# Patient Record
Sex: Female | Born: 1960 | Race: White | Hispanic: No | Marital: Married | Smoking: Never smoker
Health system: Southern US, Community
[De-identification: ages and names within clinical notes are randomized; demographics above are authoritative.]

---

## 2018-08-07 ENCOUNTER — Ambulatory Visit
Admission: EM | Admit: 2018-08-07 | Discharge: 2018-08-07 | Disposition: A | Discharge status: Home / Self Care | Payer: BLUE CROSS/BLUE SHIELD | Attending: Family Medicine | Admitting: Family Medicine

## 2018-08-07 ENCOUNTER — Other Ambulatory Visit: Payer: Self-pay

## 2018-08-07 ENCOUNTER — Ambulatory Visit (INDEPENDENT_AMBULATORY_CARE_PROVIDER_SITE_OTHER): Payer: BLUE CROSS/BLUE SHIELD

## 2018-08-07 ENCOUNTER — Encounter: Payer: Self-pay | Admitting: Emergency Medicine

## 2018-08-07 DIAGNOSIS — W2209XA Striking against other stationary object, initial encounter: Secondary | ICD-10-CM

## 2018-08-07 DIAGNOSIS — M79674 Pain in right toe(s): Secondary | ICD-10-CM

## 2018-08-07 DIAGNOSIS — S92501A Displaced unspecified fracture of right lesser toe(s), initial encounter for closed fracture: Secondary | ICD-10-CM | POA: Diagnosis not present

## 2018-08-07 NOTE — Discharge Instructions (Signed)
Rest.  Ice.  Naproxen as needed.  Take care  Dr. Adriana Simas

## 2018-08-07 NOTE — ED Provider Notes (Signed)
[facility]    CSN: [id number] Arrival date & time: 08/07/18  1040  History   Chief Complaint Chief Complaint  Patient presents with  . Toe Pain   HPI  58 year old female presents with right fifth toe pain.  Patient states that she injured her toe on Thursday after she accidentally hit it on the couch.  She states that she has had pain, bruising, and swelling.  Minimal pain currently.  Patient is concerned that she may have fractured her toe.  Patient is primarily concerned as she is an avid runner and wants to know when she will be able to return to her normal activities.  She has taken some naproxen with some improvement.  Exacerbated by activity.  No other associated symptoms.  No other complaints.  History reviewed and updated as below.  PMH: No significant PMH.  OB History   No obstetric history on file.    Home Medications    Prior to Admission medications   Not on File   Social History Social History   Tobacco Use  . Smoking status: Never Smoker  . Smokeless tobacco: Never Used  Substance Use Topics  . Alcohol use: Yes  . Drug use: Never   Allergies   Patient has no known allergies.  Review of Systems Review of Systems  Musculoskeletal:       Toe pain/injury.  Skin:       Bruising.   Physical Exam Triage Vital Signs ED Triage Vitals  Enc Vitals Group     BP 08/07/18 1051 (!) 155/95     Pulse Rate 08/07/18 1051 65     Resp 08/07/18 1051 18     Temp 08/07/18 1051 98.1 F (36.7 C)     Temp Source 08/07/18 1051 Oral     SpO2 08/07/18 1051 100 %     Weight 08/07/18 1052 105 lb (47.6 kg)     Height 08/07/18 1052 5\' 4"  (1.626 m)     Head Circumference --      Peak Flow --      Pain Score 08/07/18 1052 0     Pain Loc --      Pain Edu? --      Excl. in GC? --    Updated Vital Signs BP (!) 155/95 (BP Location: Right Arm)   Pulse 65   Temp 98.1 F (36.7 C) (Oral)   Resp 18   Ht 5\' 4"  (1.626 m)   Wt 47.6 kg   SpO2 100%   BMI 18.02  kg/m   Visual Acuity Right Eye Distance:   Left Eye Distance:   Bilateral Distance:    Right Eye Near:   Left Eye Near:    Bilateral Near:     Physical Exam Vitals signs and nursing note reviewed.  Constitutional:      General: She is not in acute distress.    Appearance: Normal appearance.  HENT:     Head: Normocephalic and atraumatic.  Eyes:     General:        Right eye: No discharge.        Left eye: No discharge.     Conjunctiva/sclera: Conjunctivae normal.  Musculoskeletal:     Comments: Right 5th toe -mild tenderness to palpation.  Skin:    Comments: Right foot -bruising noted around the fifth toe as well proximally.   Neurological:     Mental Status: She is alert.  Psychiatric:        Mood  and Affect: Mood normal.        Behavior: Behavior normal.    UC Treatments / Results  Labs (all labs ordered are listed, but only abnormal results are displayed) Labs Reviewed - No data to display  EKG None  Radiology Dg Toe 5th Right  Result Date: 08/07/2018 CLINICAL DATA:  Pain, foot injury on Thursday EXAM: RIGHT FIFTH TOE COMPARISON:  None. FINDINGS: There is a acute fracture through the base of the fifth proximal phalanx. The fracture fragments are in near anatomic alignment. No dislocations. IMPRESSION: 1. Acute fracture involves the base of the fifth proximal phalanx. Electronically Signed   By: Signa Kell M.D.   On: 08/07/2018 11:47    Procedures Procedures (including critical care time)  Medications Ordered in UC Medications - No data to display  Initial Impression / Assessment and Plan / UC Course  I have reviewed the triage vital signs and the nursing notes.  Pertinent labs & imaging results that were available during my care of the patient were reviewed by me and considered in my medical decision making (see chart for details).    58 year old female presents with a fracture of the base of the fifth proximal phalanx.  Advised buddy taping.   Over-the-counter naproxen as needed.  Rest, ice. Avoid running.  Final Clinical Impressions(s) / UC Diagnoses   Final diagnoses:  Closed fracture of phalanx of right fifth toe, initial encounter     Discharge Instructions     Rest.  Ice.  Naproxen as needed.  Take care  Dr. Adriana Simas     ED Prescriptions    None     Controlled Substance Prescriptions [place] Controlled Substance Registry consulted? Not Applicable   Tommie Sams, DO 08/07/18 1153

## 2018-08-07 NOTE — ED Triage Notes (Signed)
Patient c/o right pinky toe injury on Thursday after she kicked the couch. She states it is painful, bruised and swollen.

## 2019-11-13 IMAGING — CR RIGHT FIFTH TOE
3 series · 3 of 3 positions shown · non-contrast
Comparison: None.

CLINICAL DATA: Pain, foot injury on [REDACTED]

EXAM:
RIGHT FIFTH TOE

[toe ap]
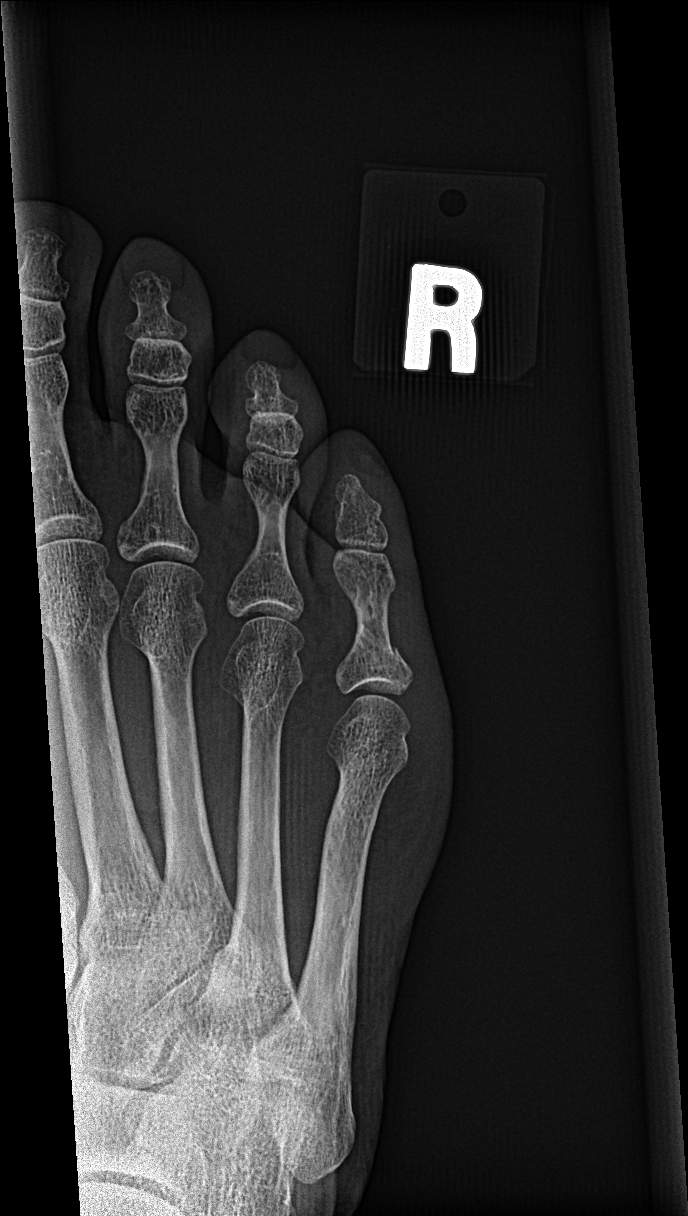

[toe obl]
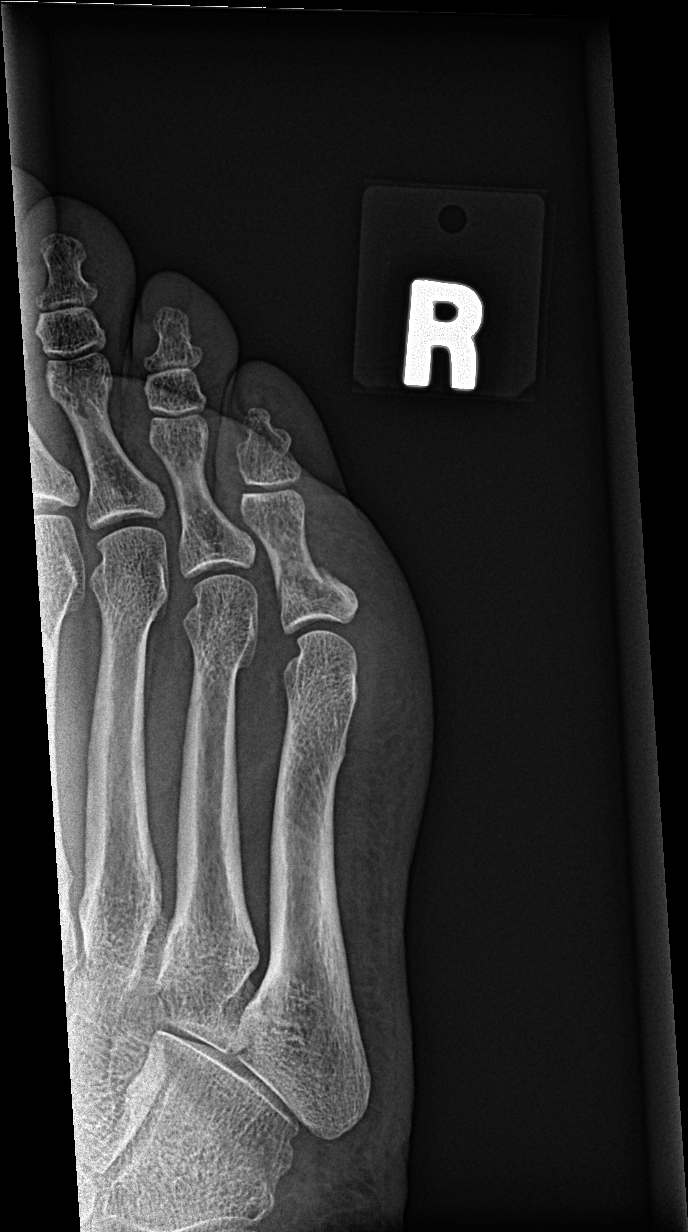

[toe lat]
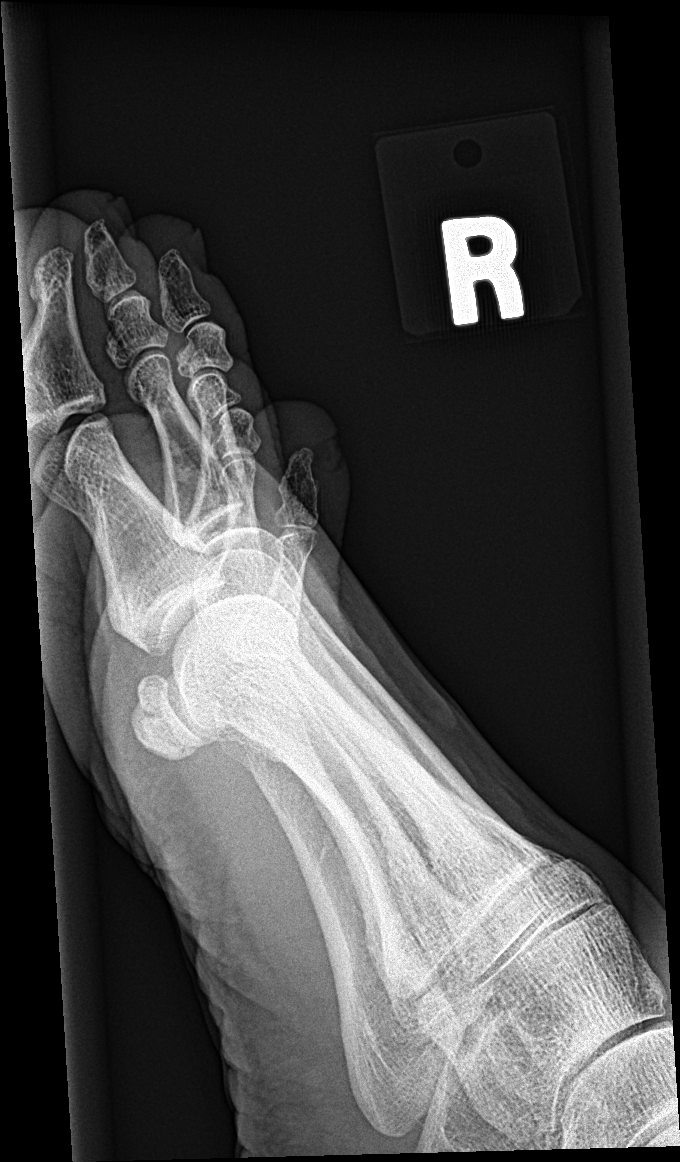

[3 of 3 positions shown; findings below may reference images not displayed]

FINDINGS: There is a acute fracture through the base of the fifth proximal
phalanx. The fracture fragments are in near anatomic alignment. No
dislocations.
IMPRESSION: 1. Acute fracture involves the base of the fifth proximal phalanx.
